# Patient Record
Sex: Male | Born: 1995 | Race: White | Hispanic: No | Marital: Single | Smoking: Current every day smoker
Health system: Southern US, Community
[De-identification: ages and names within clinical notes are randomized; demographics above are authoritative.]

## PROBLEM LIST (undated history)

## (undated) DIAGNOSIS — Z9889 Other specified postprocedural states: Secondary | ICD-10-CM

---
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
## 2017-06-20 ENCOUNTER — Emergency Department
Admission: EM | Admit: 2017-06-20 | Discharge: 2017-06-21 | Disposition: A | Discharge status: Home / Self Care | Payer: Medicaid Other | Attending: Emergency Medicine | Admitting: Emergency Medicine

## 2017-06-20 ENCOUNTER — Other Ambulatory Visit: Payer: Self-pay

## 2017-06-20 ENCOUNTER — Emergency Department: Payer: Medicaid Other

## 2017-06-20 DIAGNOSIS — G8929 Other chronic pain: Secondary | ICD-10-CM | POA: Insufficient documentation

## 2017-06-20 DIAGNOSIS — F17200 Nicotine dependence, unspecified, uncomplicated: Secondary | ICD-10-CM | POA: Diagnosis not present

## 2017-06-20 DIAGNOSIS — M25562 Pain in left knee: Secondary | ICD-10-CM | POA: Insufficient documentation

## 2017-06-20 MED ORDER — MELOXICAM 15 MG PO TABS: 15.0000 mg | ORAL_TABLET | Freq: Every day | ORAL | 0 refills | Status: AC

## 2017-06-20 MED ORDER — CYCLOBENZAPRINE HCL 5 MG PO TABS: 5.0000 mg | ORAL_TABLET | Freq: Three times a day (TID) | ORAL | 0 refills | Status: DC | PRN
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
## 2017-06-20 NOTE — Discharge Instructions (Signed)
Your exam and x-ray are negative for any acute fracture, dislocation, or meniscus injury. You should follow-up with ortho for continued symptoms. Take the prescription meds as directed. Wear the ace bandage as needed for support.

## 2017-06-20 NOTE — ED Notes (Signed)
Patient c/o chronic back and left knee pain with increasing severity the last few days. Patient reports his left knee has been locking up more than normal.

## 2017-06-21 NOTE — ED Notes (Signed)
Pt denied wanting ace wrap. Ace wrap not placed.

## 2017-06-21 NOTE — ED Notes (Signed)
Reviewed d/c instructions, follow-up care, prescriptions with patient. Patient verbalized understanding.  

## 2017-12-07 ENCOUNTER — Emergency Department
Admission: EM | Admit: 2017-12-07 | Discharge: 2017-12-07 | Disposition: A | Discharge status: Home / Self Care | Payer: Medicaid Other | Attending: Emergency Medicine | Admitting: Emergency Medicine

## 2017-12-07 ENCOUNTER — Other Ambulatory Visit: Payer: Self-pay

## 2017-12-07 DIAGNOSIS — Y999 Unspecified external cause status: Secondary | ICD-10-CM | POA: Diagnosis not present

## 2017-12-07 DIAGNOSIS — Y939 Activity, unspecified: Secondary | ICD-10-CM | POA: Diagnosis not present

## 2017-12-07 DIAGNOSIS — Y929 Unspecified place or not applicable: Secondary | ICD-10-CM | POA: Diagnosis not present

## 2017-12-07 DIAGNOSIS — T148XXA Other injury of unspecified body region, initial encounter: Secondary | ICD-10-CM

## 2017-12-07 DIAGNOSIS — S3992XA Unspecified injury of lower back, initial encounter: Secondary | ICD-10-CM | POA: Diagnosis present

## 2017-12-07 DIAGNOSIS — Z23 Encounter for immunization: Secondary | ICD-10-CM | POA: Insufficient documentation

## 2017-12-07 DIAGNOSIS — F17200 Nicotine dependence, unspecified, uncomplicated: Secondary | ICD-10-CM | POA: Diagnosis not present

## 2017-12-07 DIAGNOSIS — S39012A Strain of muscle, fascia and tendon of lower back, initial encounter: Secondary | ICD-10-CM | POA: Insufficient documentation

## 2017-12-07 DIAGNOSIS — X500XXA Overexertion from strenuous movement or load, initial encounter: Secondary | ICD-10-CM | POA: Insufficient documentation

## 2017-12-07 HISTORY — DX: Other specified postprocedural states: Z98.890

## 2017-12-07 LAB — URINALYSIS, COMPLETE (UACMP) WITH MICROSCOPIC
BACTERIA UA: NONE SEEN
Bilirubin Urine: NEGATIVE
GLUCOSE, UA: NEGATIVE mg/dL
Hgb urine dipstick: NEGATIVE
Ketones, ur: NEGATIVE mg/dL
Leukocytes, UA: NEGATIVE
Nitrite: NEGATIVE
Protein, ur: NEGATIVE mg/dL
Specific Gravity, Urine: 1.017 (ref 1.005–1.030)
pH: 6 (ref 5.0–8.0)

## 2017-12-07 MED ORDER — MELOXICAM 15 MG PO TABS: 15.0000 mg | ORAL_TABLET | Freq: Every day | ORAL | 0 refills | Status: AC

## 2017-12-07 MED ORDER — TETANUS-DIPHTH-ACELL PERTUSSIS 5-2.5-18.5 LF-MCG/0.5 IM SUSP
0.5000 mL | Freq: Once | INTRAMUSCULAR | Status: AC
  Administered 2017-12-07: 0.5 mL via INTRAMUSCULAR
  Filled 2017-12-07: qty 0.5

## 2017-12-07 MED ORDER — CYCLOBENZAPRINE HCL 5 MG PO TABS: ORAL_TABLET | ORAL | 0 refills | Status: DC

## 2017-12-07 NOTE — ED Triage Notes (Addendum)
Pt states mid low back pain since yesterday. Also c/o R pinky cut from unscrewing light bulb. No bleeding noted. Asking for work note in triage. Alert, oriented, ambulatory with difficulty.

## 2017-12-07 NOTE — ED Provider Notes (Signed)
[facility] Emergency Department Provider Note  ____________________________________________  Time seen: Approximately 2:45 PM  I have reviewed the triage vital signs and the nursing notes.   HISTORY  Chief Complaint Back Pain    HPI Melvin Griffith. is a 22 y.o. male that presents to the emergency department for evaluation of back pain for 1 day. He was lifting something heavy yesterday and mowing the yard when pain started. Pain feels like a muscle strain. He has not taken any ibuprofen today because he took a lot yesterday. He also cut himself on a light bulb. No bleeding. He would like a work note. No bowel or bladder dysfunction or saddle paresthesias.  No fever, chills, nausea, vomiting, abdominal pain, dysuria, urgency, frequency.  Past Medical History:  Diagnosis Date  . History of open heart surgery     There are no active problems to display for this patient.   Past Surgical History:  Procedure Laterality Date  . EXPLORATION POST OPERATIVE OPEN HEART  2014    Prior to Admission medications   Medication Sig Start Date End Date Taking? Authorizing Provider  cyclobenzaprine (FLEXERIL) 5 MG tablet Take 1-2 tablets 3 times daily as needed 12/07/17   Enid Derry, PA-C  meloxicam (MOBIC) 15 MG tablet Take 1 tablet (15 mg total) by mouth daily for 10 days. 12/07/17 12/17/17  Enid Derry, PA-C    Allergies Patient has no known allergies.  History reviewed. No pertinent family history.  Social History Social History   Tobacco Use  . Smoking status: Current Every Day Smoker  . Smokeless tobacco: Never Used  Substance Use Topics  . Alcohol use: Yes    Alcohol/week: 1.2 oz    Types: 2 Cans of beer per week  . Drug use: Yes    Types: Marijuana     Review of Systems  Constitutional: No fever/chills Cardiovascular: No chest pain. Respiratory: o SOB. Gastrointestinal: No abdominal pain.  No nausea, no vomiting.  Musculoskeletal:  Positive for back pain.  Skin: Negative for rash, abrasions, lacerations, ecchymosis. Neurological: Negative for numbness or tingling   ____________________________________________   PHYSICAL EXAM:  VITAL SIGNS: ED Triage Vitals [12/07/17 1314]  Enc Vitals Group     BP 134/69     Pulse Rate 100     Resp 18     Temp 98.3 F (36.8 C)     Temp Source Oral     SpO2 99 %     Weight 140 lb (63.5 kg)     Height 6' (1.829 m)     Head Circumference      Peak Flow      Pain Score 7     Pain Loc      Pain Edu?      Excl. in GC?      Constitutional: Alert and oriented. Well appearing and in no acute distress. Eyes: Conjunctivae are normal. PERRL. EOMI. Head: Atraumatic. ENT:      Ears:      Nose: No congestion/rhinnorhea.      Mouth/Throat: Mucous membranes are moist.  Neck: No stridor. Cardiovascular: Normal rate, regular rhythm.  Good peripheral circulation. Respiratory: Normal respiratory effort without tachypnea or retractions. Lungs CTAB. Good air entry to the bases with no decreased or absent breath sounds. Gastrointestinal: Bowel sounds 4 quadrants. Soft and nontender to palpation. No guarding or rigidity. No palpable masses. No distention.  Musculoskeletal: Full range of motion to all extremities. No gross deformities appreciated. Tenderness to palpation  over right lumbar muscle. No lumbar spinal tenderness to palpation. Negative straight leg raise.  Neurologic:  Normal speech and language. No gross focal neurologic deficits are appreciated.  Skin:  Skin is warm, dry and intact. No rash noted. No cut from light bulb noted.  ____________________________________________   LABS (all labs ordered are listed, but only abnormal results are displayed)  Labs Reviewed  URINALYSIS, COMPLETE (UACMP) WITH MICROSCOPIC - Abnormal; Notable for the following components:      Result Value   Color, Urine YELLOW (*)    APPearance CLEAR (*)    All other components within normal  limits   ____________________________________________  EKG   ____________________________________________  RADIOLOGY  No results found.  ____________________________________________    PROCEDURES  Procedure(s) performed:    Procedures    Medications  Tdap (BOOSTRIX) injection 0.5 mL (has no administration in time range)     ____________________________________________   INITIAL IMPRESSION / ASSESSMENT AND PLAN / ED COURSE  Pertinent labs & imaging results that were available during my care of the patient were reviewed by me and considered in my medical decision making (see chart for details).  Review of the [place] CSRS was performed in accordance of the [facility] prior to dispensing any controlled drugs.   Patient's diagnosis is consistent with muscle strain. Vital signs and exam are reassuring. No infection on urinalysis. Tetanus shot was updated. Patient will be discharged home with prescriptions for flexeril and mobic. Patient is to follow up with PCP as directed. Patient is given ED precautions to return to the ED for any worsening or new symptoms.   ____________________________________________  FINAL CLINICAL IMPRESSION(S) / ED DIAGNOSES  Final diagnoses:  Muscle strain      NEW MEDICATIONS STARTED DURING THIS VISIT:  ED Discharge Orders        Ordered    cyclobenzaprine (FLEXERIL) 5 MG tablet     12/07/17 1555    meloxicam (MOBIC) 15 MG tablet  Daily     12/07/17 1555          This chart was dictated using voice recognition software/Dragon. Despite best efforts to proofread, errors can occur which can change the meaning. Any change was purely unintentional.    Enid Derry, PA-C 12/07/17 1640    Sharman Cheek, MD 12/08/17 2027

## 2017-12-07 NOTE — ED Notes (Signed)
See triage note  Presents with right flank area since yesterday  States pain is non radiating  No n/v/  States pain started after mowing yesterday

## 2018-01-19 ENCOUNTER — Encounter: Payer: Self-pay | Admitting: Emergency Medicine

## 2018-01-19 ENCOUNTER — Emergency Department
Admission: EM | Admit: 2018-01-19 | Discharge: 2018-01-19 | Disposition: A | Discharge status: Home / Self Care | Payer: Medicaid Other | Attending: Emergency Medicine | Admitting: Emergency Medicine

## 2018-01-19 DIAGNOSIS — F17200 Nicotine dependence, unspecified, uncomplicated: Secondary | ICD-10-CM | POA: Diagnosis not present

## 2018-01-19 DIAGNOSIS — L237 Allergic contact dermatitis due to plants, except food: Secondary | ICD-10-CM | POA: Diagnosis not present

## 2018-01-19 DIAGNOSIS — R21 Rash and other nonspecific skin eruption: Secondary | ICD-10-CM | POA: Diagnosis present

## 2018-01-19 MED ORDER — TRIAMCINOLONE ACETONIDE 40 MG/ML IJ SUSP
40.0000 mg | Freq: Once | INTRAMUSCULAR | Status: AC
  Administered 2018-01-19: 40 mg via INTRAMUSCULAR
  Filled 2018-01-19: qty 1

## 2018-01-19 MED ORDER — TRIAMCINOLONE ACETONIDE 0.1 % EX CREA: 1.0000 "application " | TOPICAL_CREAM | Freq: Four times a day (QID) | CUTANEOUS | 0 refills | Status: DC

## 2018-01-19 NOTE — ED Notes (Signed)
Pt has itching rash on hands and face since yesterday.  No resp distress.  Pt took otc meds without relief.

## 2018-01-19 NOTE — ED Provider Notes (Signed)
[facility] Emergency Department Provider Note  ____________________________________________  Time seen: Approximately 8:57 PM  I have reviewed the triage vital signs and the nursing notes.   HISTORY  Chief Complaint Poison Ivy    HPI Melvin SeverinChristopher S Springer Jr. is a 22 y.o. male who presents the emergency department complaining of poison ivy to bilateral hands, right-sided neck, left jawline.  Patient reports that he believes he had contact with poison ivy behind his house.  He has extreme pruritus from same.  He has had similar rash in the past from poison ivy.  No other new foods, medications, contact with new topicals such as soaps or shampoos.  Patient tried Benadryl which helped somewhat with the itching but did not improve rash.  No other complaints at this time.    Past Medical History:  Diagnosis Date  . History of open heart surgery     There are no active problems to display for this patient.   Past Surgical History:  Procedure Laterality Date  . EXPLORATION POST OPERATIVE OPEN HEART  2014    Prior to Admission medications   Medication Sig Start Date End Date Taking? Authorizing Provider  cyclobenzaprine (FLEXERIL) 5 MG tablet Take 1-2 tablets 3 times daily as needed 12/07/17   Enid DerryWagner, Ashley, PA-C  triamcinolone cream (KENALOG) 0.1 % Apply 1 application topically 4 (four) times daily. 01/19/18   Cuthriell, Delorise RoyalsJonathan D, PA-C    Allergies Patient has no known allergies.  No family history on file.  Social History Social History   Tobacco Use  . Smoking status: Current Every Day Smoker  . Smokeless tobacco: Never Used  Substance Use Topics  . Alcohol use: Yes    Alcohol/week: 1.2 oz    Types: 2 Cans of beer per week  . Drug use: Yes    Types: Marijuana     Review of Systems  Constitutional: No fever/chills Eyes: No visual changes. No discharge ENT: No upper respiratory complaints. Cardiovascular: no chest pain. Respiratory: no  cough. No SOB. Gastrointestinal: No abdominal pain.  No nausea, no vomiting.  Musculoskeletal: Negative for musculoskeletal pain. Skin: Poison ivy rash to bilateral hands, right neck, left jaw Neurological: Negative for headaches, focal weakness or numbness. 10-point ROS otherwise negative.  ____________________________________________   PHYSICAL EXAM:  VITAL SIGNS: ED Triage Vitals  Enc Vitals Group     BP 01/19/18 2020 (!) 146/85     Pulse Rate 01/19/18 2020 87     Resp 01/19/18 2020 12     Temp 01/19/18 2020 99.4 F (37.4 C)     Temp Source 01/19/18 2020 Oral     SpO2 01/19/18 2020 100 %     Weight --      Height --      Head Circumference --      Peak Flow --      Pain Score 01/19/18 2012 3     Pain Loc --      Pain Edu? --      Excl. in GC? --      Constitutional: Alert and oriented. Well appearing and in no acute distress. Eyes: Conjunctivae are normal. PERRL. EOMI. Head: Atraumatic. ENT:      Ears:       Nose: No congestion/rhinnorhea.      Mouth/Throat: Mucous membranes are moist.  No oropharyngeal edema. Neck: No stridor.    Cardiovascular: Normal rate, regular rhythm. Normal S1 and S2.  Good peripheral circulation. Respiratory: Normal respiratory effort without tachypnea or  retractions. Lungs CTAB. Good air entry to the bases with no decreased or absent breath sounds. Musculoskeletal: Full range of motion to all extremities. No gross deformities appreciated. Neurologic:  Normal speech and language. No gross focal neurologic deficits are appreciated.  Skin:  Skin is warm, dry and intact.  Maculopapular rash to bilateral hands, fingers, right neck, left jaw.  Rash is consistent with poison ivy dermatitis.  No other skin findings. Psychiatric: Mood and affect are normal. Speech and behavior are normal. Patient exhibits appropriate insight and judgement.   ____________________________________________   LABS (all labs ordered are listed, but only abnormal  results are displayed)  Labs Reviewed - No data to display ____________________________________________  EKG   ____________________________________________  RADIOLOGY   No results found.  ____________________________________________    PROCEDURES  Procedure(s) performed:    Procedures    Medications  triamcinolone acetonide (KENALOG-40) injection 40 mg (has no administration in time range)     ____________________________________________   INITIAL IMPRESSION / ASSESSMENT AND PLAN / ED COURSE  Pertinent labs & imaging results that were available during my care of the patient were reviewed by me and considered in my medical decision making (see chart for details).  Review of the [place] CSRS was performed in accordance of the [facility] prior to dispensing any controlled drugs.      Patient's diagnosis is consistent with poison ivy dermatitis.  Patient presents with pruritic rash to bilateral hands, right neck, left jaw.  Rash is consistent with poison ivy dermatitis.  Patient is given injection of Kenalog in the emergency department.  Topical triamcinolone is prescribed for home use.  Patient will follow primary care as needed. Patient is given ED precautions to return to the ED for any worsening or new symptoms.     ____________________________________________  FINAL CLINICAL IMPRESSION(S) / ED DIAGNOSES  Final diagnoses:  Poison ivy dermatitis      NEW MEDICATIONS STARTED DURING THIS VISIT:  ED Discharge Orders        Ordered    triamcinolone cream (KENALOG) 0.1 %  4 times daily     01/19/18 2107          This chart was dictated using voice recognition software/Dragon. Despite best efforts to proofread, errors can occur which can change the meaning. Any change was purely unintentional.    Racheal Patches, PA-C 01/19/18 2110    Phineas Semen, MD 01/19/18 2142

## 2018-01-19 NOTE — ED Triage Notes (Signed)
Pt comes into the ED via POV c/o poison ivy to the left hand and possibly the right side of the face.  Patient in NAD this time.
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
## 2019-06-27 ENCOUNTER — Emergency Department
Admission: EM | Admit: 2019-06-27 | Discharge: 2019-06-27 | Disposition: A | Discharge status: Home / Self Care | Payer: Medicaid Other | Attending: Emergency Medicine | Admitting: Emergency Medicine

## 2019-06-27 ENCOUNTER — Other Ambulatory Visit: Payer: Self-pay

## 2019-06-27 ENCOUNTER — Encounter: Payer: Self-pay | Admitting: Emergency Medicine

## 2019-06-27 DIAGNOSIS — Y939 Activity, unspecified: Secondary | ICD-10-CM | POA: Insufficient documentation

## 2019-06-27 DIAGNOSIS — S61412A Laceration without foreign body of left hand, initial encounter: Secondary | ICD-10-CM

## 2019-06-27 DIAGNOSIS — W268XXA Contact with other sharp object(s), not elsewhere classified, initial encounter: Secondary | ICD-10-CM | POA: Insufficient documentation

## 2019-06-27 DIAGNOSIS — Y990 Civilian activity done for income or pay: Secondary | ICD-10-CM | POA: Insufficient documentation

## 2019-06-27 DIAGNOSIS — Y929 Unspecified place or not applicable: Secondary | ICD-10-CM | POA: Insufficient documentation

## 2019-06-27 DIAGNOSIS — F17210 Nicotine dependence, cigarettes, uncomplicated: Secondary | ICD-10-CM | POA: Insufficient documentation

## 2019-06-27 MED ORDER — CLINDAMYCIN HCL 150 MG PO CAPS
300.0000 mg | ORAL_CAPSULE | Freq: Once | ORAL | Status: AC
  Administered 2019-06-27: 18:00:00 300 mg via ORAL
  Filled 2019-06-27: qty 2

## 2019-06-27 MED ORDER — CLINDAMYCIN HCL 300 MG PO CAPS: 300.0000 mg | ORAL_CAPSULE | Freq: Four times a day (QID) | ORAL | 0 refills | Status: DC

## 2019-06-27 NOTE — ED Notes (Signed)
See triage note  States he hit his left hand on a piece of metal    This occurred this past weekend

## 2019-06-27 NOTE — ED Triage Notes (Signed)
Pt reports this weekend was working with some scrap metal and cut his left hand knuckle. Pt reports when he went to work today he was a little shaky and his job told him that he needed a work note.

## 2019-06-27 NOTE — ED Provider Notes (Signed)
[facility] Emergency Department Provider Note  ____________________________________________  Time seen: Approximately 5:30 PM  I have reviewed the triage vital signs and the nursing notes.   HISTORY  Chief Complaint Laceration    HPI Melvin Griffith. is a 23 y.o. male who presents the emergency department for evaluation of injury to the knuckle of the index finger left hand.  Patient was working with some scrap metal 2 days ago when he sustained a laceration over the joint.  Patient reports the area is painful, yesterday it was swollen and erythematous.  Patient reports that these edema and erythema has improved though the pain continues.  Patient has good range of motion to the digit.  No medications prior to arrival.         Past Medical History:  Diagnosis Date  . History of open heart surgery     There are no active problems to display for this patient.   Past Surgical History:  Procedure Laterality Date  . EXPLORATION POST OPERATIVE OPEN HEART  2014    Prior to Admission medications   Medication Sig Start Date End Date Taking? Authorizing Provider  clindamycin (CLEOCIN) 300 MG capsule Take 1 capsule (300 mg total) by mouth 4 (four) times daily. 06/27/19   Ahnya Akre, Delorise Royals, PA-C    Allergies Patient has no known allergies.  No family history on file.  Social History Social History   Tobacco Use  . Smoking status: Current Every Day Smoker  . Smokeless tobacco: Never Used  Substance Use Topics  . Alcohol use: Yes    Alcohol/week: 2.0 standard drinks    Types: 2 Cans of beer per week  . Drug use: Yes    Types: Marijuana     Review of Systems  Constitutional: No fever/chills Eyes: No visual changes. No discharge ENT: No upper respiratory complaints. Cardiovascular: no chest pain. Respiratory: no cough. No SOB. Gastrointestinal: No abdominal pain.  No nausea, no vomiting.   Musculoskeletal: Negative for  musculoskeletal pain. Skin: Laceration overlying the MCP joint of the second digit left hand Neurological: Negative for headaches, focal weakness or numbness. 10-point ROS otherwise negative.  ____________________________________________   PHYSICAL EXAM:  VITAL SIGNS: ED Triage Vitals  Enc Vitals Group     BP 06/27/19 1631 (!) 162/91     Pulse Rate 06/27/19 1631 84     Resp 06/27/19 1631 20     Temp 06/27/19 1633 98.3 F (36.8 C)     Temp Source 06/27/19 1633 Oral     SpO2 06/27/19 1631 100 %     Weight 06/27/19 1632 160 lb (72.6 kg)     Height 06/27/19 1632 6\' 1"  (1.854 m)     Head Circumference --      Peak Flow --      Pain Score 06/27/19 1632 7     Pain Loc --      Pain Edu? --      Excl. in GC? --      Constitutional: Alert and oriented. Well appearing and in no acute distress. Eyes: Conjunctivae are normal. PERRL. EOMI. Head: Atraumatic. ENT:      Ears:       Nose: No congestion/rhinnorhea.      Mouth/Throat: Mucous membranes are moist.  Neck: No stridor.    Cardiovascular: Normal rate, regular rhythm. Normal S1 and S2.  Good peripheral circulation. Respiratory: Normal respiratory effort without tachypnea or retractions. Lungs CTAB. Good air entry to the bases with no  decreased or absent breath sounds. Musculoskeletal: Full range of motion to all extremities. No gross deformities appreciated.  Visualization of the left hand reveals linear laceration overlying the MCP joint on the dorsal aspect second digit.  Edges are smooth in nature.  They are gaped open at this time.  Visualization reveals no deep injury concerning for tendon laceration.  Patient has good extension and flexion of the digit at this time.  Sensation and capillary refill intact distally.  Edges are smooth in nature, show signs of tissue death consistent with an injury that occurred 2 days ago.  No foreign body.  No bleeding.  No gross surrounding erythema or edema.  No streaking. Neurologic:  Normal  speech and language. No gross focal neurologic deficits are appreciated.  Skin:  Skin is warm, dry and intact. No rash noted. Psychiatric: Mood and affect are normal. Speech and behavior are normal. Patient exhibits appropriate insight and judgement.   ____________________________________________   LABS (all labs ordered are listed, but only abnormal results are displayed)  Labs Reviewed - No data to display ____________________________________________  EKG   ____________________________________________  RADIOLOGY   No results found.  ____________________________________________    PROCEDURES  Procedure(s) performed:    Procedures    Medications  clindamycin (CLEOCIN) capsule 300 mg (has no administration in time range)     ____________________________________________   INITIAL IMPRESSION / ASSESSMENT AND PLAN / ED COURSE  Pertinent labs & imaging results that were available during my care of the patient were reviewed by me and considered in my medical decision making (see chart for details).  Review of the [place] CSRS was performed in accordance of the [place] prior to dispensing any controlled drugs.           Patient's diagnosis is consistent with laceration to the left hand.  Patient presented to emergency department with 2-day old laceration overlying the MCP joint of the second digit left hand.  No gross signs of infection.  Patient has good extension and flexion of the digit with no concern at this time for tendon injury.  Patient will be placed on antibiotics prophylactically.  Wound care instructions discussed with patient.  Follow-up precautions are also discussed at length with the patient.  Patient's tetanus shot is up-to-date and not repeated at this time..  Patient is given ED precautions to return to the ED for any worsening or new symptoms.     ____________________________________________  FINAL CLINICAL IMPRESSION(S) / ED DIAGNOSES  Final  diagnoses:  Laceration of left hand without foreign body, initial encounter      NEW MEDICATIONS STARTED DURING THIS VISIT:  ED Discharge Orders         Ordered    clindamycin (CLEOCIN) 300 MG capsule  4 times daily     06/27/19 1758              This chart was dictated using voice recognition software/Dragon. Despite best efforts to proofread, errors can occur which can change the meaning. Any change was purely unintentional.    Darletta Moll, PA-C 06/27/19 Orville Govern, MD 06/27/19 2238

## 2020-04-30 ENCOUNTER — Encounter: Payer: Self-pay | Admitting: *Deleted

## 2020-04-30 ENCOUNTER — Other Ambulatory Visit: Payer: Self-pay

## 2020-04-30 DIAGNOSIS — F17200 Nicotine dependence, unspecified, uncomplicated: Secondary | ICD-10-CM | POA: Insufficient documentation

## 2020-04-30 DIAGNOSIS — K047 Periapical abscess without sinus: Secondary | ICD-10-CM | POA: Insufficient documentation

## 2020-04-30 NOTE — ED Triage Notes (Signed)
Pt has right upper toothache for 4 days.   Swelling to right side of face.  Taking motrin without relief.  Pt alert.

## 2020-05-01 ENCOUNTER — Emergency Department
Admission: EM | Admit: 2020-05-01 | Discharge: 2020-05-01 | Disposition: A | Discharge status: Home / Self Care | Payer: Self-pay | Attending: Physician Assistant | Admitting: Physician Assistant

## 2020-05-01 DIAGNOSIS — K047 Periapical abscess without sinus: Secondary | ICD-10-CM

## 2020-05-01 MED ORDER — IBUPROFEN 600 MG PO TABS: 600.0000 mg | ORAL_TABLET | Freq: Three times a day (TID) | ORAL | 0 refills | Status: DC | PRN

## 2020-05-01 MED ORDER — TRAMADOL HCL 50 MG PO TABS
50.0000 mg | ORAL_TABLET | Freq: Once | ORAL | Status: AC
  Administered 2020-05-01: 08:00:00 50 mg via ORAL
  Filled 2020-05-01: qty 1

## 2020-05-01 MED ORDER — AMOXICILLIN 500 MG PO CAPS: 500.0000 mg | ORAL_CAPSULE | Freq: Three times a day (TID) | ORAL | 0 refills | Status: DC

## 2020-05-01 MED ORDER — IBUPROFEN 600 MG PO TABS
600.0000 mg | ORAL_TABLET | Freq: Once | ORAL | Status: AC
  Administered 2020-05-01: 08:00:00 600 mg via ORAL
  Filled 2020-05-01: qty 1

## 2020-05-01 MED ORDER — TRAMADOL HCL 50 MG PO TABS: 50.0000 mg | ORAL_TABLET | Freq: Four times a day (QID) | ORAL | 0 refills | Status: AC | PRN

## 2020-05-01 MED ORDER — LIDOCAINE VISCOUS HCL 2 % MT SOLN
15.0000 mL | Freq: Once | OROMUCOSAL | Status: AC
  Administered 2020-05-01: 08:00:00 15 mL via OROMUCOSAL
  Filled 2020-05-01: qty 15

## 2020-05-01 NOTE — ED Notes (Signed)
See triage note  Presents with facial swelling and dental pain  States pain started about 4 days ago

## 2020-05-01 NOTE — ED Notes (Signed)
Pt not found in lobby for vs retake

## 2020-05-01 NOTE — Discharge Instructions (Addendum)
Follow discharge care instruction take medication as directed.  Follow-up for list of dental clinics in your discharge care instructions. OPTIONS FOR DENTAL FOLLOW UP CARE  [place] [facility] and [facility] Services - Local Safety Net Dental Clinics [email or web address]   [facility] [phone])  [facility] [phone])  [facility] [phone])  [facility] [phone])  [facility] [phone]) This clinic caters to the indigent population and is on a lottery system. Location: [employer] [facility], [employer], [address] [place] Clinic Hours: Wednesdays from 6pm - 9pm, patients seen by a lottery system. For dates, call or go to [email or web address] Services: Cleanings, fillings and simple extractions. Payment Options: DENTAL WORK IS FREE OF CHARGE. Bring proof of income or support. Best way to get seen: Arrive at 5:15 pm - this is a lottery, NOT first come/first serve, so arriving earlier will not increase your chances of being seen.     [facility] [phone] Select option 1 for emergencies   Location: [facility], [place], [address], [place] Clinic Hours: No walk-ins accepted - call the day before to schedule an appointment. Check in times are 9:30 am and 1:30 pm. Services: Simple extractions, temporary fillings, pulpectomy/pulp debridement, uncomplicated abscess drainage. Payment Options: PAYMENT IS DUE AT THE TIME OF SERVICE.  Fee is usually $100-200, additional surgical procedures (e.g. abscess drainage) may be extra. Cash, checks, Visa/MasterCard accepted.  Can file Medicaid if patient is covered for dental - patient should call case worker to check. No discount for [facility] patients. Best way to get seen: MUST call the day before and get  onto the schedule. Can usually be seen the next 1-2 days. No walk-ins accepted.     [facility] [phone]   Location: [facility], [address], [place] Clinic Hours: M, W, Th, F 8am or 1:30pm, Tues 9a or 1:30 - first come/first served. Services: Simple extractions, temporary fillings, uncomplicated abscess drainage.  You do not need to be an [facility] resident. Payment Options: PAYMENT IS DUE AT THE TIME OF SERVICE. Dental insurance, otherwise sliding scale - bring proof of income or support. Depending on income and treatment needed, cost is usually $50-200. Best way to get seen: Arrive early as it is first come/first served.     [facility] [phone]   Location: [address] Clinic Hours: Mon-Thu 8a-5p Services: Most basic dental services including extractions and fillings. Payment Options: PAYMENT IS DUE AT THE TIME OF SERVICE. Sliding scale, up to 50% off - bring proof if income or support. Medicaid with dental option accepted. Best way to get seen: Call to schedule an appointment, can usually be seen within 2 weeks OR they will try to see walk-ins - show up at 8a or 2p (you may have to wait).     [facility] [phone] [place] RESIDENTS ONLY   Location: [facility], [address], [place], [place] [address] Clinic Hours: By appointment only. Monday - Thursday 8am-5pm, Friday 8am-12pm Services: Cleanings, fillings, extractions. Payment Options: PAYMENT IS DUE AT THE TIME OF SERVICE. Cash, Visa or MasterCard. Sliding scale - $30 minimum per service. Best way to get seen: Come in to office, complete packet and make an appointment - need proof of income or support monies for each household member and proof of [facility] residence. Usually takes about a  month to get in.     [facility] [phone]   Location: [address], [place] Clinic Hours: Walk-in Urgent Care Dental Services are offered Monday-Friday mornings only. The numbers of emergencies accepted daily is limited to the number of providers available. Maximum 15 - Mondays, Wednesdays & Thursdays Maximum 10 - Tuesdays & Fridays Services: You do not need to be a [facility] resident to be seen for a dental emergency. Emergencies are defined as pain, swelling, abnormal bleeding, or dental trauma. Walkins will receive x-rays if needed. NOTE: Dental cleaning is not an emergency. Payment Options: PAYMENT IS DUE AT THE TIME OF SERVICE. Minimum co-pay is $40.00 for uninsured patients. Minimum co-pay is $3.00 for Medicaid with dental coverage. Dental Insurance is accepted and must be presented at time of visit. Medicare does not cover dental. Forms of payment: Cash, credit card, checks. Best way to get seen: If not previously registered with the clinic, walk-in dental registration begins at 7:15 am and is on a first come/first serve basis. If previously registered with the clinic, call to make an appointment.     [facility] [place] ONLY   Location: [address], [place], [place] Clinic Hours: Mon-Thu 10a-2p Services: Extractions only! Payment Options: FREE (donations accepted) - bring proof of income or support Best way to get seen: Call and schedule an appointment OR come at 8am on the 1st Monday of every month (except for holidays) when it is first come/first served.     [facility] [phone]   Location: [place], [place] Clinic Hours: Friday mornings Services, Payment Options, Best way to get seen: Call for info

## 2020-05-01 NOTE — ED Provider Notes (Signed)
[facility] Emergency Department Provider Note   ____________________________________________   None    (approximate)  I have reviewed the triage vital signs and the nursing notes.   HISTORY  Chief Complaint Dental Pain    HPI Melvin Dubie. is a 24 y.o. male patient presents with toothache for 4 days.  Patient in the last 24 hours increased swelling and pain to the right side of his face.  Patient states Motrin is not relieving the pain.  Patient denies fever associated with complaint.  Patient j does not have a dentist.  Rates pain as a 10/10.  Described pain as "achy".      Past Medical History:  Diagnosis Date  . History of open heart surgery     There are no problems to display for this patient.   Past Surgical History:  Procedure Laterality Date  . EXPLORATION POST OPERATIVE OPEN HEART  2014    Prior to Admission medications   Medication Sig Start Date End Date Taking? Authorizing Provider  amoxicillin (AMOXIL) 500 MG capsule Take 1 capsule (500 mg total) by mouth 3 (three) times daily. 05/01/20   Joni Reining, PA-C  ibuprofen (ADVIL) 600 MG tablet Take 1 tablet (600 mg total) by mouth every 8 (eight) hours as needed. 05/01/20   Joni Reining, PA-C  traMADol (ULTRAM) 50 MG tablet Take 1 tablet (50 mg total) by mouth every 6 (six) hours as needed. 05/01/20 05/01/21  Joni Reining, PA-C    Allergies Patient has no known allergies.  No family history on file.  Social History Social History   Tobacco Use  . Smoking status: Current Every Day Smoker  . Smokeless tobacco: Never Used  Substance Use Topics  . Alcohol use: Yes    Alcohol/week: 2.0 standard drinks    Types: 2 Cans of beer per week  . Drug use: Yes    Types: Marijuana    Review of Systems Constitutional: No fever/chills Eyes: No visual changes. ENT: No sore throat.  Dental pain. Cardiovascular: Denies chest pain. Respiratory: Denies shortness of  breath. Gastrointestinal: No abdominal pain.  No nausea, no vomiting.  No diarrhea.  No constipation. Genitourinary: Negative for dysuria. Musculoskeletal: Negative for back pain. Skin: Negative for rash. Neurological: Negative for headaches, focal weakness or numbness.   ____________________________________________   PHYSICAL EXAM:  VITAL SIGNS: ED Triage Vitals  Enc Vitals Group     BP 04/30/20 2313 (!) 144/89     Pulse Rate 04/30/20 2313 98     Resp 04/30/20 2313 20     Temp 04/30/20 2313 98.7 F (37.1 C)     Temp Source 04/30/20 2313 Oral     SpO2 04/30/20 2313 98 %     Weight 04/30/20 2311 145 lb (65.8 kg)     Height 04/30/20 2311 6\' 1"  (1.854 m)     Head Circumference --      Peak Flow --      Pain Score 04/30/20 2311 10     Pain Loc --      Pain Edu? --      Excl. in GC? --    Constitutional: Alert and oriented. Well appearing and in no acute distress. Eyes: Conjunctivae are normal. PERRL. EOMI. Head: Atraumatic. Nose: No congestion/rhinnorhea. Mouth/Throat: Mucous membranes are moist.  Oropharynx non-erythematous.  Multiple dental caries of the right upper molar. Hematological/Lymphatic/Immunilogical: No cervical lymphadenopathy. Cardiovascular: Normal rate, regular rhythm. Grossly normal heart sounds.  Good peripheral circulation.  Respiratory: Normal respiratory effort.  No retractions. Lungs CTAB. Skin:  Skin is warm, dry and intact. No rash noted.  Edema right lateral maxillary facial area Psychiatric: Mood and affect are normal. Speech and behavior are normal.  ____________________________________________   LABS (all labs ordered are listed, but only abnormal results are displayed)  Labs Reviewed - No data to display ____________________________________________  EKG   ____________________________________________  RADIOLOGY  ED MD interpretation:    Official radiology report(s): No results  found.  ____________________________________________   PROCEDURES  Procedure(s) performed (including Critical Care):  Procedures   ____________________________________________   INITIAL IMPRESSION / ASSESSMENT AND PLAN / ED COURSE  As part of my medical decision making, I reviewed the following data within the electronic MEDICAL RECORD NUMBER     Patient presents with pain and facial edema secondary to dental caries/abscess.  Patient given discharge care instruction advised take medication as directed.  Patient advised to follow-up with [place] to do to clinic provided discharge care instructions.          ____________________________________________   FINAL CLINICAL IMPRESSION(S) / ED DIAGNOSES  Final diagnoses:  Dental abscess     ED Discharge Orders         Ordered    amoxicillin (AMOXIL) 500 MG capsule  3 times daily        05/01/20 0728    traMADol (ULTRAM) 50 MG tablet  Every 6 hours PRN        05/01/20 0728    ibuprofen (ADVIL) 600 MG tablet  Every 8 hours PRN        05/01/20 0728    amoxicillin (AMOXIL) 500 MG capsule  3 times daily        Pending    ibuprofen (ADVIL) 600 MG tablet  Every 8 hours PRN        Pending    traMADol (ULTRAM) 50 MG tablet  Every 6 hours PRN        Pending          *Please note:  Melvin Griffith. was evaluated in Emergency Department on 05/01/2020 for the symptoms described in the history of present illness. He was evaluated in the context of the global COVID-19 pandemic, which necessitated consideration that the patient might be at risk for infection with the SARS-CoV-2 virus that causes COVID-19. Institutional protocols and algorithms that pertain to the evaluation of patients at risk for COVID-19 are in a state of rapid change based on information released by regulatory bodies including the CDC and federal and state organizations. These policies and algorithms were followed during the patient's care in the ED.  Some ED  evaluations and interventions may be delayed as a result of limited staffing during and the pandemic.*   Note:  This document was prepared using Dragon voice recognition software and may include unintentional dictation errors.    Joni Reining, PA-C 05/01/20 4540    Willy Eddy, MD 05/01/20 1017

## 2023-02-09 ENCOUNTER — Inpatient Hospital Stay
Admission: EM | Admit: 2023-02-09 | Discharge: 2023-02-11 | DRG: 917 | Disposition: A | Discharge status: Home / Self Care | Payer: Self-pay | Attending: Internal Medicine | Admitting: Internal Medicine

## 2023-02-09 ENCOUNTER — Other Ambulatory Visit: Payer: Self-pay

## 2023-02-09 ENCOUNTER — Emergency Department: Payer: Self-pay

## 2023-02-09 DIAGNOSIS — Z720 Tobacco use: Secondary | ICD-10-CM | POA: Diagnosis present

## 2023-02-09 DIAGNOSIS — F1910 Other psychoactive substance abuse, uncomplicated: Secondary | ICD-10-CM | POA: Diagnosis present

## 2023-02-09 DIAGNOSIS — F1011 Alcohol abuse, in remission: Secondary | ICD-10-CM | POA: Diagnosis present

## 2023-02-09 DIAGNOSIS — R4182 Altered mental status, unspecified: Secondary | ICD-10-CM | POA: Diagnosis present

## 2023-02-09 DIAGNOSIS — E876 Hypokalemia: Secondary | ICD-10-CM | POA: Diagnosis present

## 2023-02-09 DIAGNOSIS — R400 Somnolence: Secondary | ICD-10-CM

## 2023-02-09 DIAGNOSIS — Y929 Unspecified place or not applicable: Secondary | ICD-10-CM

## 2023-02-09 DIAGNOSIS — F1210 Cannabis abuse, uncomplicated: Secondary | ICD-10-CM | POA: Diagnosis present

## 2023-02-09 DIAGNOSIS — T40601A Poisoning by unspecified narcotics, accidental (unintentional), initial encounter: Principal | ICD-10-CM

## 2023-02-09 DIAGNOSIS — G928 Other toxic encephalopathy: Secondary | ICD-10-CM | POA: Diagnosis present

## 2023-02-09 DIAGNOSIS — I469 Cardiac arrest, cause unspecified: Secondary | ICD-10-CM | POA: Diagnosis present

## 2023-02-09 DIAGNOSIS — F17210 Nicotine dependence, cigarettes, uncomplicated: Secondary | ICD-10-CM | POA: Diagnosis present

## 2023-02-09 DIAGNOSIS — E872 Acidosis, unspecified: Secondary | ICD-10-CM | POA: Diagnosis present

## 2023-02-09 LAB — COMPREHENSIVE METABOLIC PANEL
ALT: 19 U/L (ref 0–44)
AST: 23 U/L (ref 15–41)
Albumin: 4.5 g/dL (ref 3.5–5.0)
Alkaline Phosphatase: 58 U/L (ref 38–126)
Anion gap: 10 (ref 5–15)
BUN: 22 mg/dL — ABNORMAL HIGH (ref 6–20)
CO2: 22 mmol/L (ref 22–32)
Calcium: 8.3 mg/dL — ABNORMAL LOW (ref 8.9–10.3)
Chloride: 104 mmol/L (ref 98–111)
Creatinine, Ser: 1.14 mg/dL (ref 0.61–1.24)
GFR, Estimated: >60 mL/min (ref >60)
Glucose, Bld: 130 mg/dL — ABNORMAL HIGH (ref 70–99)
Potassium: 2.7 mmol/L — CL (ref 3.5–5.1)
Sodium: 136 mmol/L (ref 135–145)
Total Bilirubin: 0.6 mg/dL (ref 0.3–1.2)
Total Protein: 7.6 g/dL (ref 6.5–8.1)

## 2023-02-09 LAB — CBC WITH DIFFERENTIAL/PLATELET
Abs Immature Granulocytes: 0.05 10*3/uL (ref 0.00–0.07)
Basophils Absolute: 0.1 10*3/uL (ref 0.0–0.1)
Basophils Relative: 1 %
Eosinophils Absolute: 0.2 10*3/uL (ref 0.0–0.5)
Eosinophils Relative: 2 %
HCT: 45.3 % (ref 39.0–52.0)
Hemoglobin: 14.7 g/dL (ref 13.0–17.0)
Immature Granulocytes: 0 %
Lymphocytes Relative: 30 %
Lymphs Abs: 3.4 10*3/uL (ref 0.7–4.0)
MCH: 27 pg (ref 26.0–34.0)
MCHC: 32.5 g/dL (ref 30.0–36.0)
MCV: 83.1 fL (ref 80.0–100.0)
Monocytes Absolute: 1 10*3/uL (ref 0.1–1.0)
Monocytes Relative: 9 %
Neutro Abs: 6.6 10*3/uL (ref 1.7–7.7)
Neutrophils Relative %: 58 %
Platelets: 275 10*3/uL (ref 150–400)
RBC: 5.45 MIL/uL (ref 4.22–5.81)
RDW: 13 % (ref 11.5–15.5)
WBC: 11.3 10*3/uL — ABNORMAL HIGH (ref 4.0–10.5)
nRBC: 0 % (ref 0.0–0.2)

## 2023-02-09 LAB — CK: Total CK: 275 U/L (ref 49–397)

## 2023-02-09 LAB — LACTIC ACID, PLASMA: Lactic Acid, Venous: 1 mmol/L (ref 0.5–1.9)

## 2023-02-09 LAB — TROPONIN I (HIGH SENSITIVITY)
Troponin I (High Sensitivity): 6 ng/L (ref <18)
Troponin I (High Sensitivity): 32 ng/L — ABNORMAL HIGH (ref <18)

## 2023-02-09 MED ORDER — NALOXONE HCL 4 MG/10ML IJ SOLN
0.2500 mg/h | INTRAVENOUS | Status: DC
  Administered 2023-02-10: 0.25 mg/h via INTRAVENOUS
  Filled 2023-02-09: qty 10

## 2023-02-09 MED ORDER — SODIUM CHLORIDE 0.9 % IV BOLUS
1000.0000 mL | Freq: Once | INTRAVENOUS | Status: AC
  Administered 2023-02-09: 1000 mL via INTRAVENOUS

## 2023-02-09 MED ORDER — LACTATED RINGERS IV SOLN: INTRAVENOUS | Status: DC

## 2023-02-09 MED ORDER — ACETAMINOPHEN 325 MG PO TABS: 650.0000 mg | ORAL_TABLET | Freq: Four times a day (QID) | ORAL | Status: DC | PRN

## 2023-02-09 MED ORDER — CHLORHEXIDINE GLUCONATE CLOTH 2 % EX PADS
6.0000 | MEDICATED_PAD | Freq: Every day | CUTANEOUS | Status: DC
  Administered 2023-02-10: 6 via TOPICAL

## 2023-02-09 MED ORDER — NICOTINE 21 MG/24HR TD PT24
21.0000 mg | MEDICATED_PATCH | Freq: Every day | TRANSDERMAL | Status: DC
  Administered 2023-02-10: 21 mg via TRANSDERMAL
  Filled 2023-02-09: qty 1

## 2023-02-09 MED ORDER — PANTOPRAZOLE SODIUM 40 MG IV SOLR
40.0000 mg | Freq: Two times a day (BID) | INTRAVENOUS | Status: DC
  Administered 2023-02-10: 40 mg via INTRAVENOUS
  Filled 2023-02-09: qty 10

## 2023-02-09 MED ORDER — HEPARIN SODIUM (PORCINE) 5000 UNIT/ML IJ SOLN
5000.0000 [IU] | Freq: Two times a day (BID) | INTRAMUSCULAR | Status: DC
  Administered 2023-02-10 (×3): 5000 [IU] via SUBCUTANEOUS
  Filled 2023-02-09 (×3): qty 1

## 2023-02-09 MED ORDER — POTASSIUM CHLORIDE 10 MEQ/100ML IV SOLN
10.0000 meq | INTRAVENOUS | Status: AC
  Administered 2023-02-09 (×3): 10 meq via INTRAVENOUS
  Filled 2023-02-09 (×3): qty 100

## 2023-02-09 MED ORDER — THIAMINE HCL 100 MG/ML IJ SOLN: 100.0000 mg | Freq: Every day | INTRAMUSCULAR | Status: DC

## 2023-02-09 MED ORDER — NALOXONE HCL 2 MG/2ML IJ SOSY
1.0000 mg | PREFILLED_SYRINGE | Freq: Once | INTRAMUSCULAR | Status: AC
  Administered 2023-02-09: 1 mg via INTRAVENOUS
  Filled 2023-02-09: qty 2

## 2023-02-09 MED ORDER — SODIUM CHLORIDE 0.9% FLUSH
3.0000 mL | Freq: Two times a day (BID) | INTRAVENOUS | Status: DC
  Administered 2023-02-09 – 2023-02-10 (×3): 3 mL via INTRAVENOUS

## 2023-02-09 MED ORDER — ONDANSETRON HCL 4 MG/2ML IJ SOLN
4.0000 mg | Freq: Once | INTRAMUSCULAR | Status: AC
  Administered 2023-02-09: 4 mg via INTRAVENOUS
  Filled 2023-02-09: qty 2

## 2023-02-09 MED ORDER — NALOXONE HCL 2 MG/2ML IJ SOSY: 1.0000 mg | PREFILLED_SYRINGE | INTRAMUSCULAR | Status: DC | PRN

## 2023-02-09 MED ORDER — ACETAMINOPHEN 650 MG RE SUPP: 650.0000 mg | Freq: Four times a day (QID) | RECTAL | Status: DC | PRN

## 2023-02-09 NOTE — ED Notes (Addendum)
Critical lab reported on patient. Potassium is 2.7. Notified MD Ray

## 2023-02-09 NOTE — ED Notes (Addendum)
Patient attempted to urinate with father's assistance to the restroom. Patient states he is unable to urinate at this time

## 2023-02-09 NOTE — ED Triage Notes (Signed)
Pt arrived via [facility]. Per EMS pt was found on the asphat outside of [place] in [place]. Pt received CPR in the field, 1mg  of narcan brought him around. Pt was alert when he arrived in he ED and is able to answer questions, but very lethargic. Pt is extremely diaphoretic.

## 2023-02-09 NOTE — ED Notes (Signed)
Informed patient that urine was needed.

## 2023-02-09 NOTE — H&P (Signed)
History and Physical    Patient: Melvin Griffith. ZOX:[id number] DOB: Mar 12, 1996 DOA: 02/09/2023 DOS: the patient was seen and examined on 02/09/2023 PCP: Patient, No Pcp Per  Patient coming from:  unknown.   Chief Complaint:  Chief Complaint  Patient presents with   Drug Overdose    HPI: Melvin Griffith. is a 27 y.o. male with medical history significant for polysubstance abuse coming after ? Cardiac arrest or unresponsive.   Review of Systems: Review of Systems  Unable to perform ROS: Mental status change     Past Medical History:  Diagnosis Date   History of open heart surgery    Past Surgical History:  Procedure Laterality Date   EXPLORATION POST OPERATIVE OPEN HEART  2014   Social History:  reports that he has been smoking. He has never used smokeless tobacco. He reports current alcohol use of about 2.0 standard drinks of alcohol per week. He reports current drug use. Drug: Marijuana.  No Known Allergies  No family history on file.  Prior to Admission medications   Medication Sig Start Date End Date Taking? Authorizing Provider  amoxicillin (AMOXIL) 500 MG capsule Take 1 capsule (500 mg total) by mouth 3 (three) times daily. Patient not taking: Reported on 02/09/2023 05/01/20   Joni Reining, PA-C  ibuprofen (ADVIL) 600 MG tablet Take 1 tablet (600 mg total) by mouth every 8 (eight) hours as needed. Patient not taking: Reported on 02/09/2023 05/01/20   Joni Reining, PA-C     Vitals:   02/09/23 2145 02/09/23 2200 02/09/23 2215 02/09/23 2230  BP: 124/88 (!) 135/91 (!) 132/95 (!) 130/95  Pulse: 90 88 87 88  Resp: 16 (!) 9 10 15   Temp:      TempSrc:      SpO2: 97% 94% 96% 93%  Height:       Physical Exam Constitutional:      General: He is not in acute distress.   Labs on Admission: I have personally reviewed following labs and imaging studies  CBC: Recent Labs  Lab 02/09/23 1829  WBC 11.3*  NEUTROABS 6.6  HGB 14.7  HCT 45.3  MCV 83.1   PLT 275   Basic Metabolic Panel: Recent Labs  Lab 02/09/23 1829  NA 136  K 2.7*  CL 104  CO2 22  GLUCOSE 130*  BUN 22*  CREATININE 1.14  CALCIUM 8.3*   GFR: CrCl cannot be calculated (Unknown ideal weight.). Liver Function Tests: Recent Labs  Lab 02/09/23 1829  AST 23  ALT 19  ALKPHOS 58  BILITOT 0.6  PROT 7.6  ALBUMIN 4.5   No results for input(s): "LIPASE", "AMYLASE" in the last 168 hours. No results for input(s): "AMMONIA" in the last 168 hours. Coagulation Profile: No results for input(s): "INR", "PROTIME" in the last 168 hours. Cardiac Enzymes: Recent Labs  Lab 02/09/23 1829  CKTOTAL 275   BNP (last 3 results) No results for input(s): "PROBNP" in the last 8760 hours. HbA1C: No results for input(s): "HGBA1C" in the last 72 hours. CBG: No results for input(s): "GLUCAP" in the last 168 hours. Lipid Profile: No results for input(s): "CHOL", "HDL", "LDLCALC", "TRIG", "CHOLHDL", "LDLDIRECT" in the last 72 hours. Thyroid Function Tests: No results for input(s): "TSH", "T4TOTAL", "FREET4", "T3FREE", "THYROIDAB" in the last 72 hours. Anemia Panel: No results for input(s): "VITAMINB12", "FOLATE", "FERRITIN", "TIBC", "IRON", "RETICCTPCT" in the last 72 hours. Urine analysis: Urinalysis    Component Value Date/Time   COLORURINE YELLOW (A) 12/07/2017  1423   APPEARANCEUR CLEAR (A) 12/07/2017 1423   APPEARANCEUR Clear 10/28/2013 0335   LABSPEC 1.017 12/07/2017 1423   LABSPEC 1.017 10/28/2013 0335   PHURINE 6.0 12/07/2017 1423   GLUCOSEU NEGATIVE 12/07/2017 1423   GLUCOSEU Negative 10/28/2013 0335   HGBUR NEGATIVE 12/07/2017 1423   BILIRUBINUR NEGATIVE 12/07/2017 1423   BILIRUBINUR Negative 10/28/2013 0335   KETONESUR NEGATIVE 12/07/2017 1423   PROTEINUR NEGATIVE 12/07/2017 1423   NITRITE NEGATIVE 12/07/2017 1423   LEUKOCYTESUR NEGATIVE 12/07/2017 1423   LEUKOCYTESUR Negative 10/28/2013 0335    Unresulted Labs (From admission, onward)     Start      Ordered   02/09/23 1829  Urinalysis, Routine w reflex microscopic -Urine, Clean Catch  Once,   URGENT       Question:  Specimen Source  Answer:  Urine, Clean Catch   02/09/23 1828   02/09/23 1829  Urine Drug Screen, Qualitative  Once,   URGENT        02/09/23 1828             Radiological Exams on Admission: DG Chest 1 View  Result Date: 02/09/2023 CLINICAL DATA:  Chest compression, evaluate for fracture or pneumothorax. EXAM: CHEST  1 VIEW COMPARISON:  CT angiogram chest 10/29/2014 FINDINGS: Sternotomy wires are present. The heart size and mediastinal contours are within normal limits. Both lungs are clear. The visualized skeletal structures are unremarkable. IMPRESSION: No active disease. Electronically Signed   By: Darliss Cheney M.D.   On: 02/09/2023 19:12     Data Reviewed: Relevant notes from primary care and specialist visits, past discharge summaries as available in EHR, including Care Everywhere. Prior diagnostic testing as pertinent to current admission diagnoses Updated medications and problem lists for reconciliation ED course, including vitals, labs, imaging, treatment and response to treatment Triage notes, nursing and pharmacy notes and ED provider's notes Notable results as noted in HPI Assessment and Plan: No notes have been filed under this hospital service. Service: Hospitalist       DVT prophylaxis:  Heparin   Consults:  none  Advance Care Planning:    Code Status: Not on file   Family Communication:  none Disposition Plan:  Back to previous home environment  Severity of Illness: The appropriate patient status for this patient is INPATIENT. Inpatient status is judged to be reasonable and necessary in order to provide the required intensity of service to ensure the patient's safety. The patient's presenting symptoms, physical exam findings, and initial radiographic and laboratory data in the context of their chronic comorbidities is felt to place  them at high risk for further clinical deterioration. Furthermore, it is not anticipated that the patient will be medically stable for discharge from the hospital within 2 midnights of admission.   * I certify that at the point of admission it is my clinical judgment that the patient will require inpatient hospital care spanning beyond 2 midnights from the point of admission due to high intensity of service, high risk for further deterioration and high frequency of surveillance required.*  Author: Gertha Calkin, MD 02/09/2023 11:08 PM  For on call review [email or web address].

## 2023-02-09 NOTE — ED Provider Notes (Signed)
[facility] Provider Note    Event Date/Time   First MD Initiated Contact with Patient 02/09/23 1819     (approximate)   History   Drug Overdose   HPI  Thedore Griffith. is a 27 y.o. male presenting to the emergency department for evaluation following a cardiac arrest.  Patient was found unresponsive on the pavement by a bystander.  Unknown downtime.  Reportedly without a pulse, received CPR in the field, unknown duration.  He was given 1 mg of Narcan after which patient awoke.  On presentation to the ER, patient is somnolent but briefly arousable.  He tells me that he snorted something that he thought was fentanyl.  Denies intent of self-harm.  Remembers waking up this more, but is unsure how long he was on the ground.    Physical Exam   Triage Vital Signs: ED Triage Vitals  Enc Vitals Group     BP 02/09/23 1826 128/78     Pulse Rate 02/09/23 1826 90     Resp 02/09/23 1826 11     Temp 02/09/23 1939 97.7 F (36.5 C)     Temp Source 02/09/23 1939 Oral     SpO2 02/09/23 1825 96 %     Weight --      Height 02/09/23 1829 6' (1.829 m)     Head Circumference --      Peak Flow --      Pain Score 02/09/23 1827 0     Pain Loc --      Pain Edu? --      Excl. in GC? --     Most recent vital signs: Vitals:   02/09/23 2215 02/09/23 2230  BP: (!) 132/95 (!) 130/95  Pulse: 87 88  Resp: 10 15  Temp:    SpO2: 96% 93%     General: Somnolent, briefly arouses and speaks in few words, profusely diaphoretic CV:  Regular rate, good peripheral perfusion.  Resp:  Lungs clear, unlabored respirations.  Abd:  Soft, nondistended, no appreciable tenderness Neuro:  No gross facial asymmetry, moving extremities spontaneously, difficult to keep aroused long enough to participate in strength testing   ED Results / Procedures / Treatments   Labs (all labs ordered are listed, but only abnormal results are displayed) Labs Reviewed  CBC WITH  DIFFERENTIAL/PLATELET - Abnormal; Notable for the following components:      Result Value   WBC 11.3 (*)    All other components within normal limits  COMPREHENSIVE METABOLIC PANEL - Abnormal; Notable for the following components:   Potassium 2.7 (*)    Glucose, Bld 130 (*)    BUN 22 (*)    Calcium 8.3 (*)    All other components within normal limits  TROPONIN I (HIGH SENSITIVITY) - Abnormal; Notable for the following components:   Troponin I (High Sensitivity) 32 (*)    All other components within normal limits  CK  URINALYSIS, ROUTINE W REFLEX MICROSCOPIC  URINE DRUG SCREEN, QUALITATIVE ([facility] ONLY)  D-DIMER, QUANTITATIVE  LACTIC ACID, PLASMA  LACTIC ACID, PLASMA  ETHANOL  TROPONIN I (HIGH SENSITIVITY)     EKG EKG independently reviewed interpreted by myself (ER attending) demonstrates:  EKG demonstrates sinus rhythm at a rate of 92, PR 160, QRS 111, QTc 463, no acute ST changes  RADIOLOGY Imaging independently reviewed and interpreted by myself demonstrates:  CXR without visible displaced rib fracture or pneumothorax  PROCEDURES:  Critical Care performed: Yes, see critical care procedure  note(s)  CRITICAL CARE Performed by: Trinna Post   Total critical care time: 32 minutes  Critical care time was exclusive of separately billable procedures and treating other patients.  Critical care was necessary to treat or prevent imminent or life-threatening deterioration.  Critical care was time spent personally by me on the following activities: development of treatment plan with patient and/or surrogate as well as nursing, discussions with consultants, evaluation of patient's response to treatment, examination of patient, obtaining history from patient or surrogate, ordering and performing treatments and interventions, ordering and review of laboratory studies, ordering and review of radiographic studies, pulse oximetry and re-evaluation of patient's  condition.   Procedures   MEDICATIONS ORDERED IN ED: Medications  naloxone (NARCAN) injection 1 mg (has no administration in time range)  naloxone HCl (NARCAN) 4 mg in dextrose 5 % 250 mL infusion (has no administration in time range)  sodium chloride 0.9 % bolus 1,000 mL (0 mLs Intravenous Stopped 02/09/23 1932)  naloxone (NARCAN) injection 1 mg (1 mg Intravenous Given 02/09/23 1835)  potassium chloride 10 mEq in 100 mL IVPB (0 mEq Intravenous Stopped 02/09/23 2313)  ondansetron (ZOFRAN) injection 4 mg (4 mg Intravenous Given 02/09/23 2016)     IMPRESSION / MDM / ASSESSMENT AND PLAN / ED COURSE  I reviewed the triage vital signs and the nursing notes.  Differential diagnosis includes, but is not limited to, cardiac arrest secondary to opiate intoxication, consideration for ACS, rhabdomyolysis, electrolyte abnormality  Patient's presentation is most consistent with acute presentation with potential threat to life or bodily function.  27 year old male presenting after opiate overdose complicated by cardiac arrest.  Patient awake but somnolent on presentation here.  Shortly after my initial evaluation, notified by RN that patient was becoming apneic, unresponsive.  He was given additional 1 mg of Narcan.  He was observed for several hours and did not require repeat dosing.  He was given IV fluids and Zofran.  His initial lab work demonstrated mild leukocytosis.  CK within normal limits.  CMP notable for hypokalemia with K of 2.7.  He was ordered for IV repletion.  His initial troponin was negative, but repeat did uptrend to 32.  In the setting of cardiac arrest with uptrending troponin, do think patient is appropriate for admission.  On reevaluation, patient is more awake but remains somnolent.  He is agreeable with plan for admission.  Will reach out to hospitalist team.  Case discussed with Dr. Allena Katz.  She will evaluate the patient for anticipated admission.     FINAL CLINICAL IMPRESSION(S) /  ED DIAGNOSES   Final diagnoses:  Opiate overdose, accidental or unintentional, initial encounter [facility])  Cardiac arrest (HCC)     Rx / DC Orders   ED Discharge Orders     None        Note:  This document was prepared using Dragon voice recognition software and may include unintentional dictation errors.   Trinna Post, MD 02/09/23 [phone]

## 2023-02-10 ENCOUNTER — Inpatient Hospital Stay (HOSPITAL_COMMUNITY)
Admit: 2023-02-10 | Discharge: 2023-02-10 | Disposition: A | Discharge status: Home / Self Care | Payer: Self-pay | Attending: Internal Medicine | Admitting: Internal Medicine

## 2023-02-10 DIAGNOSIS — F1910 Other psychoactive substance abuse, uncomplicated: Secondary | ICD-10-CM

## 2023-02-10 DIAGNOSIS — T40601A Poisoning by unspecified narcotics, accidental (unintentional), initial encounter: Secondary | ICD-10-CM

## 2023-02-10 DIAGNOSIS — R4182 Altered mental status, unspecified: Secondary | ICD-10-CM

## 2023-02-10 DIAGNOSIS — R0609 Other forms of dyspnea: Secondary | ICD-10-CM

## 2023-02-10 LAB — URINE DRUG SCREEN, QUALITATIVE (ARMC ONLY)
Amphetamines, Ur Screen: POSITIVE — AB
Barbiturates, Ur Screen: NOT DETECTED
Benzodiazepine, Ur Scrn: NOT DETECTED
Cannabinoid 50 Ng, Ur ~~LOC~~: POSITIVE — AB
Cocaine Metabolite,Ur ~~LOC~~: NOT DETECTED
MDMA (Ecstasy)Ur Screen: POSITIVE — AB
Methadone Scn, Ur: NOT DETECTED
Opiate, Ur Screen: POSITIVE — AB
Phencyclidine (PCP) Ur S: NOT DETECTED
Tricyclic, Ur Screen: NOT DETECTED

## 2023-02-10 LAB — COMPREHENSIVE METABOLIC PANEL
ALT: 18 U/L (ref 0–44)
AST: 23 U/L (ref 15–41)
Albumin: 4.4 g/dL (ref 3.5–5.0)
Alkaline Phosphatase: 56 U/L (ref 38–126)
Anion gap: 9 (ref 5–15)
BUN: 20 mg/dL (ref 6–20)
CO2: 23 mmol/L (ref 22–32)
Calcium: 8.3 mg/dL — ABNORMAL LOW (ref 8.9–10.3)
Chloride: 103 mmol/L (ref 98–111)
Creatinine, Ser: 0.94 mg/dL (ref 0.61–1.24)
GFR, Estimated: >60 mL/min (ref >60)
Glucose, Bld: 138 mg/dL — ABNORMAL HIGH (ref 70–99)
Potassium: 4.1 mmol/L (ref 3.5–5.1)
Sodium: 135 mmol/L (ref 135–145)
Total Bilirubin: 0.5 mg/dL (ref 0.3–1.2)
Total Protein: 7.4 g/dL (ref 6.5–8.1)

## 2023-02-10 LAB — CBC
HCT: 41 % (ref 39.0–52.0)
Hemoglobin: 13.6 g/dL (ref 13.0–17.0)
MCH: 27.2 pg (ref 26.0–34.0)
MCHC: 33.2 g/dL (ref 30.0–36.0)
MCV: 82 fL (ref 80.0–100.0)
Platelets: 213 10*3/uL (ref 150–400)
RBC: 5 MIL/uL (ref 4.22–5.81)
RDW: 12.8 % (ref 11.5–15.5)
WBC: 8.6 10*3/uL (ref 4.0–10.5)
nRBC: 0 % (ref 0.0–0.2)

## 2023-02-10 LAB — GLUCOSE, CAPILLARY: Glucose-Capillary: 148 mg/dL — ABNORMAL HIGH (ref 70–99)

## 2023-02-10 LAB — URINALYSIS, ROUTINE W REFLEX MICROSCOPIC
Bacteria, UA: NONE SEEN
Bilirubin Urine: NEGATIVE
Glucose, UA: NEGATIVE mg/dL
Hgb urine dipstick: NEGATIVE
Ketones, ur: 5 mg/dL — AB
Leukocytes,Ua: NEGATIVE
Nitrite: NEGATIVE
Protein, ur: 100 mg/dL — AB
Specific Gravity, Urine: 1.035 — ABNORMAL HIGH (ref 1.005–1.030)
pH: 5 (ref 5.0–8.0)

## 2023-02-10 LAB — MAGNESIUM
Magnesium: 1.9 mg/dL (ref 1.7–2.4)
Magnesium: 1.8 mg/dL (ref 1.7–2.4)

## 2023-02-10 LAB — TROPONIN I (HIGH SENSITIVITY)
Troponin I (High Sensitivity): 49 ng/L — ABNORMAL HIGH (ref <18)
Troponin I (High Sensitivity): 32 ng/L — ABNORMAL HIGH (ref <18)

## 2023-02-10 LAB — MRSA NEXT GEN BY PCR, NASAL: MRSA by PCR Next Gen: NOT DETECTED

## 2023-02-10 LAB — D-DIMER, QUANTITATIVE: D-Dimer, Quant: 0.69 ug/mL-FEU — ABNORMAL HIGH (ref 0.00–0.50)

## 2023-02-10 LAB — HIV ANTIBODY (ROUTINE TESTING W REFLEX): HIV Screen 4th Generation wRfx: NONREACTIVE

## 2023-02-10 LAB — ETHANOL: Alcohol, Ethyl (B): <10 mg/dL (ref <10)

## 2023-02-10 LAB — PROCALCITONIN: Procalcitonin: <0.1 ng/mL

## 2023-02-10 LAB — ECHOCARDIOGRAM COMPLETE: Height: 72 in

## 2023-02-10 LAB — LACTIC ACID, PLASMA: Lactic Acid, Venous: 2.2 mmol/L (ref 0.5–1.9)

## 2023-02-10 MED ORDER — PANTOPRAZOLE SODIUM 40 MG IV SOLR
40.0000 mg | Freq: Two times a day (BID) | INTRAVENOUS | Status: DC
  Administered 2023-02-10: 40 mg via INTRAVENOUS
  Filled 2023-02-10: qty 10

## 2023-02-10 MED ORDER — THIAMINE MONONITRATE 100 MG PO TABS
100.0000 mg | ORAL_TABLET | Freq: Every day | ORAL | Status: DC
  Administered 2023-02-10: 100 mg via ORAL
  Filled 2023-02-10: qty 1

## 2023-02-10 MED ORDER — SODIUM CHLORIDE 0.9 % IV SOLN
12.5000 mg | Freq: Three times a day (TID) | INTRAVENOUS | Status: DC | PRN
  Administered 2023-02-10: 12.5 mg via INTRAVENOUS
  Filled 2023-02-10: qty 12.5

## 2023-02-10 MED ORDER — LORAZEPAM 1 MG PO TABS: 1.0000 mg | ORAL_TABLET | ORAL | Status: DC | PRN

## 2023-02-10 MED ORDER — ORAL CARE MOUTH RINSE: 15.0000 mL | OROMUCOSAL | Status: DC | PRN

## 2023-02-10 MED ORDER — LACTATED RINGERS IV SOLN: INTRAVENOUS | Status: DC

## 2023-02-10 MED ORDER — FOLIC ACID 1 MG PO TABS
1.0000 mg | ORAL_TABLET | Freq: Every day | ORAL | Status: DC
  Administered 2023-02-10: 1 mg via ORAL
  Filled 2023-02-10: qty 1

## 2023-02-10 MED ORDER — LORAZEPAM 2 MG/ML IJ SOLN
1.0000 mg | INTRAMUSCULAR | Status: DC | PRN
  Administered 2023-02-10: 2 mg via INTRAVENOUS
  Filled 2023-02-10: qty 1

## 2023-02-10 MED ORDER — ADULT MULTIVITAMIN W/MINERALS CH
1.0000 | ORAL_TABLET | Freq: Every day | ORAL | Status: DC
  Administered 2023-02-10: 1 via ORAL
  Filled 2023-02-10: qty 1

## 2023-02-10 MED ORDER — THIAMINE HCL 100 MG/ML IJ SOLN: 100.0000 mg | Freq: Every day | INTRAMUSCULAR | Status: DC

## 2023-02-10 NOTE — Assessment & Plan Note (Signed)
Unclear if pt was just unresponsive or had arrested.  We will monitor in stepdown and 2 d echo.

## 2023-02-10 NOTE — Assessment & Plan Note (Signed)
Replaced and we will follow.

## 2023-02-10 NOTE — Progress Notes (Signed)
Pt to transfer to 1C-122 per MD order. Report called to [facility]. Pt able to stand up and transferred to hospital bed.All belongings sent with pt. Pt transported by charge RN. VSS at this time.

## 2023-02-10 NOTE — Assessment & Plan Note (Signed)
CIWA  protocol.  Thiamine.

## 2023-02-10 NOTE — Assessment & Plan Note (Signed)
2/2 to drug abuse pt has used multiple agents found on UDS.  Has had good response to narcan and due to him becoming somnolent we started pt on Narcan drip . CIWA protocol.

## 2023-02-10 NOTE — TOC Initial Note (Signed)
Transition of Care (TOC) - Initial/Assessment Note    Patient Details  Name: Melvin Griffith. MRN: [id number] Date of Birth: [birth year]  Transition of Care [facility]) CM/SW Contact:    Kreg Shropshire, RN Phone Number: 02/10/2023, 10:52 AM  Clinical Narrative:                 Cm received consult for SA. Pt declined any resources for substance abuse at this time. Stated he wanted resources for pcp in the area. Cm added [facility] due to pt not having insurance in the [place]. He lives with father. Father or family friend will be available to pick pt up from hospital whenever d/c. No dme or home health services prior to admission at this time. Cm will continue to follow for toc needs.  Expected Discharge Plan: Home/Self Care Barriers to Discharge: Continued Medical Work up   Patient Goals and CMS Choice            Expected Discharge Plan and Services       Living arrangements for the past 2 months: Single Family Home                                      Prior Living Arrangements/Services Living arrangements for the past 2 months: Single Family Home Lives with:: Self, Parents   Do you feel safe going back to the place where you live?: Yes               Activities of Daily Living      Permission Sought/Granted            Permission granted to share info w Relationship: Rebeca Allegra Sr. Father     Emotional Assessment Appearance:: Appears younger than stated age     Orientation: : Oriented to Self, Oriented to Place, Oriented to  Time, Oriented to Situation Alcohol / Substance Use: Illicit Drugs    Admission diagnosis:  Cardiac arrest (HCC) [I46.9] Opiate overdose, accidental or unintentional, initial encounter (HCC) [T40.601A] AMS (altered mental status) [R41.82] Patient Active Problem List   Diagnosis Date Noted   Polysubstance abuse (HCC) 02/10/2023   AMS (altered mental status) 02/09/2023   Cardiac arrest (HCC)  02/09/2023   Hypokalemia 02/09/2023   History of alcohol abuse 02/09/2023   Tobacco abuse 02/09/2023   PCP:  Patient, No Pcp Per Pharmacy:   [facility] [address] [place] - [address] [place] [address] Phone: [phone] Fax: [phone]     Social Determinants of Health (SDOH) Social History: SDOH Screenings   Tobacco Use: High Risk (08/16/2020)   SDOH Interventions:     Readmission Risk Interventions     No data to display

## 2023-02-10 NOTE — ED Notes (Signed)
Called report to beth rn [phone]

## 2023-02-10 NOTE — Assessment & Plan Note (Signed)
TTS consult 

## 2023-02-10 NOTE — Progress Notes (Addendum)
[facility]  - [place] at [facility]   PATIENT NAME: Melvin Griffith    MR#:  [id number]  DATE OF BIRTH:  [birth year]  SUBJECTIVE:  no family at bedside. Patient initially sleepy however workup answered all questions appropriately. Agreeable to try PO diet. Will wean him off Narcan drip. Remembers using some drugs given by a friend.    VITALS:  Blood pressure 127/85, pulse 62, temperature 97.8 F (36.6 C), temperature source Oral, resp. rate 10, height 6' (1.829 m), weight 80.7 kg, SpO2 100 %.  PHYSICAL EXAMINATION:   GENERAL:  27 y.o.-year-old patient with no acute distress.  LUNGS: Normal breath sounds bilaterally, no wheezing CARDIOVASCULAR: S1, S2 normal. No murmur   ABDOMEN: Soft, nontender, nondistended. Bowel sounds present.  EXTREMITIES: No  edema b/l.    NEUROLOGIC: nonfocal  patient is alert and awake SKIN: No obvious rash, lesion, or ulcer.   LABORATORY PANEL:  CBC Recent Labs  Lab 02/10/23 0721  WBC 8.6  HGB 13.6  HCT 41.0  PLT 213    Chemistries  Recent Labs  Lab 02/10/23 0104 02/10/23 0721  NA 135  --   K 4.1  --   CL 103  --   CO2 23  --   GLUCOSE 138*  --   BUN 20  --   CREATININE 0.94  --   CALCIUM 8.3*  --   MG 1.9 1.8  AST 23  --   ALT 18  --   ALKPHOS 56  --   BILITOT 0.5  --    Cardiac Enzymes No results for input(s): "TROPONINI" in the last 168 hours. RADIOLOGY:  DG Chest 1 View  Result Date: 02/09/2023 CLINICAL DATA:  Chest compression, evaluate for fracture or pneumothorax. EXAM: CHEST  1 VIEW COMPARISON:  CT angiogram chest 10/29/2014 FINDINGS: Sternotomy wires are present. The heart size and mediastinal contours are within normal limits. Both lungs are clear. The visualized skeletal structures are unremarkable. IMPRESSION: No active disease. Electronically Signed   By: Darliss Cheney M.D.   On: 02/09/2023 19:12    Assessment and Plan Melvin Griffith. is a 27 y.o. male presenting to the emergency  department for evaluation following a ?cardiac arrest.  Patient was found unresponsive on the pavement by a bystander.  Unknown downtime.  Reportedly without a pulse, received CPR in the field, unknown duration.  He was given 1 mg of Narcan after which patient awoke.  On presentation to the ER, patient is somnolent but briefly arousable.   NO EMS notes in the chart. Patient has no cardiac history. Has history of substance abuse. Does not remember what drugs to use close remember he could've use fentanyl.   Acute metabolic encephalopathy/altered mental status in the setting of polysubstance drug abuse urine drug screen positive for amphetamines, MDMA, cannabinoid, opiates -- patient placed on Narcan drip -- now alert oriented times three. Will wean off Narcan drip. -- Start PO diet -- labs stable -- patient advised to abstain from using drugs. -- TOC for substance abuse resources  ?? Cardiac arrest -- no exact details available. -- EKG showed sinus rhythm. Patient has remained in sinus rhythm with stable blood pressure -- 2D echo  Mild acidosis-- in the setting of substance abuse -- patient received IV fluids   transfer out of ICU    Procedures: none Family communication : left VM for Father  Consults : CODE STATUS: full DVT Prophylaxis : heparin Level of care: Med-Surg Status is: Inpatient  Remains inpatient appropriate because: polysubstance drug abuse. Will monitor one more day and if remains stable discharge tomorrow. Echo to be done today    TOTAL TIME TAKING CARE OF THIS PATIENT: 35 minutes.  >50% time spent on counselling and coordination of care  Note: This dictation was prepared with Dragon dictation along with smaller phrase technology. Any transcriptional errors that result from this process are unintentional.  Enedina Finner M.D    [facility]   CC: Primary care physician; Patient, No Pcp Per

## 2023-02-10 NOTE — Assessment & Plan Note (Signed)
-  Nicotine patch 

## 2023-02-10 NOTE — Discharge Instructions (Signed)
You are encouraged to call the [facility] and arrange an application appointment to be screened for service to get set up with a physician for primary care  You may print the application and take with you to the appointment. At this web address [email or web address]  Please Call [facility] for appointment  [phone]  [address] [place], [place] [address]  Office Hours Monday: Closed Tuesday: 9:00am - 4:00pm Wednesday: 9:00am - 4:00pm Thursday: 9:00am - 8:00pm Friday: Closed  Endocrinology 2nd Thursday of the Month: 5:00pm - 8:00pm  Rheumatology/Orthopedic Clinic By appointment only.  Contact for availability.   Services Not Covered Obstetrics Gastrointestinal/Liver Disease

## 2023-02-11 DIAGNOSIS — Z720 Tobacco use: Secondary | ICD-10-CM

## 2023-02-11 DIAGNOSIS — T40604A Poisoning by unspecified narcotics, undetermined, initial encounter: Secondary | ICD-10-CM

## 2023-02-11 LAB — BASIC METABOLIC PANEL
Anion gap: 7 (ref 5–15)
BUN: 11 mg/dL (ref 6–20)
CO2: 28 mmol/L (ref 22–32)
Calcium: 8.4 mg/dL — ABNORMAL LOW (ref 8.9–10.3)
Chloride: 103 mmol/L (ref 98–111)
Creatinine, Ser: 0.85 mg/dL (ref 0.61–1.24)
GFR, Estimated: >60 mL/min (ref >60)
Glucose, Bld: 95 mg/dL (ref 70–99)
Potassium: 3.7 mmol/L (ref 3.5–5.1)
Sodium: 138 mmol/L (ref 135–145)

## 2023-02-11 LAB — BLOOD GAS, ARTERIAL
Acid-Base Excess: 3.2 mmol/L — ABNORMAL HIGH (ref 0.0–2.0)
Bicarbonate: 26.9 mmol/L (ref 20.0–28.0)
O2 Saturation: 100 %
Patient temperature: 37
pCO2 arterial: 37 mmHg (ref 32–48)
pH, Arterial: 7.47 — ABNORMAL HIGH (ref 7.35–7.45)
pO2, Arterial: 132 mmHg — ABNORMAL HIGH (ref 83–108)

## 2023-02-11 LAB — CBC
HCT: 44 % (ref 39.0–52.0)
Hemoglobin: 14.3 g/dL (ref 13.0–17.0)
MCH: 26.9 pg (ref 26.0–34.0)
MCHC: 32.5 g/dL (ref 30.0–36.0)
MCV: 82.9 fL (ref 80.0–100.0)
Platelets: 197 10*3/uL (ref 150–400)
RBC: 5.31 MIL/uL (ref 4.22–5.81)
RDW: 12.5 % (ref 11.5–15.5)
WBC: 7.1 10*3/uL (ref 4.0–10.5)
nRBC: 0 % (ref 0.0–0.2)

## 2023-02-11 LAB — ECHOCARDIOGRAM COMPLETE
Area-P 1/2: 3.65 cm2
S' Lateral: 2.5 cm
Weight: 2846.58 oz

## 2023-02-11 MED ORDER — ONDANSETRON HCL 4 MG/2ML IJ SOLN: 4.0000 mg | Freq: Four times a day (QID) | INTRAMUSCULAR | Status: DC | PRN

## 2023-02-11 MED ORDER — ADULT MULTIVITAMIN W/MINERALS CH: 1.0000 | ORAL_TABLET | Freq: Every day | ORAL | 0 refills | Status: AC

## 2023-02-11 MED ORDER — SODIUM CHLORIDE 0.9 % IV BOLUS
250.0000 mL | Freq: Once | INTRAVENOUS | Status: AC
  Administered 2023-02-11: 250 mL via INTRAVENOUS

## 2023-02-11 NOTE — Progress Notes (Signed)
Pt being discharged home, discharge instructions reviewed with pt, states understanding, pt with no complaints at discharge 

## 2023-02-11 NOTE — Discharge Summary (Signed)
Physician Discharge Summary   Patient: Melvin Griffith. MRN: [id number] DOB: [birth year]  Admit date:     02/09/2023  Discharge date: 02/11/23  Discharge Physician: Enedina Finner   PCP: Patient, No Pcp Per   Recommendations at discharge:    Establish PCP with [facility] abstain from using street drugs and smoking. Refrain from using alcohol  Discharge Diagnoses: Principal Problem:   AMS (altered mental status) Active Problems:   Polysubstance abuse (HCC)   Cardiac arrest (HCC)   History of alcohol abuse   Hypokalemia   Tobacco abuse   Opiate overdose (HCC)   Melvin Griffith. is a 27 y.o. male presenting to the emergency department for evaluation following a ?cardiac arrest.  Patient was found unresponsive on the pavement by a bystander.  Unknown downtime.  Reportedly without a pulse, received CPR in the field, unknown duration.  He was given 1 mg of Narcan after which patient awoke.  On presentation to the ER, patient is somnolent but briefly arousable.    NO EMS notes in the chart. Patient has no cardiac history. Has history of substance abuse. Does not remember what drugs to use close remember he could've use fentanyl.     Acute metabolic encephalopathy/altered mental status in the setting of polysubstance drug abuse urine drug screen positive for amphetamines, MDMA, cannabinoid, opiates -- patient placed on Narcan drip -- now alert oriented times three. Will wean off Narcan drip. -- Start PO diet -- labs stable -- patient advised to abstain from using drugs. -- TOC for substance abuse resources -- overall hemodynamically stable. Patient requesting to go home. No family at bedside.   ?? Cardiac arrest -- no exact details available. -- EKG showed sinus rhythm. Patient has remained in sinus rhythm with stable blood pressure -- 2D echo within normal limits   Mild acidosis-- in the setting of substance abuse -- patient received IV fluids   overall  hemodynamically stable. Discharge to home.       Procedures: none Family communication : left VM for Father 02/10/23 Consults : CODE STATUS: full DVT Prophylaxis : heparin      Disposition: Home Diet recommendation:  Discharge Diet Orders (From admission, onward)     Start     Ordered   02/11/23 0000  Diet - low sodium heart healthy        02/11/23 0905           Regular diet DISCHARGE MEDICATION: Allergies as of 02/11/2023   No Known Allergies      Medication List     STOP taking these medications    amoxicillin 500 MG capsule Commonly known as: AMOXIL   ibuprofen 600 MG tablet Commonly known as: ADVIL       TAKE these medications    multivitamin with minerals Tabs tablet Take 1 tablet by mouth daily.         Condition at discharge: fair  The results of significant diagnostics from this hospitalization (including imaging, microbiology, ancillary and laboratory) are listed below for reference.   Imaging Studies: ECHOCARDIOGRAM COMPLETE  Result Date: 02/11/2023    ECHOCARDIOGRAM REPORT   Patient Name:   Melvin Griffith. Date of Exam: 02/10/2023 Medical Rec #:  [id number]                Height:       72.0 in Accession #:    [id number]  Weight:       177.9 lb Date of Birth:  Jul 14, 1996                 BSA:          2.027 m Patient Age:    27 years                 BP:           112/78 mmHg Patient Gender: M                        HR:           68 bpm. Exam Location:  [facility] Procedure: 2D Echo, Cardiac Doppler and Color Doppler Indications:     R06.00 Dyspnea  History:         Patient has no prior history of Echocardiogram examinations.                  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  Sonographer:     Daphine Deutscher RDCS Referring Phys:  [id number] Sriya Kroeze Diagnosing Phys: Julien Nordmann MD IMPRESSIONS  1.  Left ventricular ejection fraction, by estimation, is 50 to 55%. The left ventricle has low normal function. The left ventricle has no regional wall motion abnormalities. Septal motion consistent with postoperative state. Left ventricular diastolic parameters were normal.  2. Right ventricular systolic function is normal. The right ventricular size is normal. There is normal pulmonary artery systolic pressure. The estimated right ventricular systolic pressure is 19.6 mmHg.  3. The mitral valve is normal in structure. No evidence of mitral valve regurgitation. No evidence of mitral stenosis.  4. The aortic valve is tricuspid. Aortic valve regurgitation is not visualized. No aortic stenosis is present.  5. The inferior vena cava is normal in size with greater than 50% respiratory variability, suggesting right atrial pressure of 3 mmHg.  6. There is borderline dilatation of the aortic root, measuring 38 mm. Ascending aorta 3.4 cm FINDINGS  Left Ventricle: Left ventricular ejection fraction, by estimation, is 50 to 55%. The left ventricle has low normal function. The left ventricle has no regional wall motion abnormalities. The left ventricular internal cavity size was normal in size. There is no left ventricular hypertrophy. Left ventricular diastolic parameters were normal. Right Ventricle: The right ventricular size is normal. No increase in right ventricular wall thickness. Right ventricular systolic function is normal. There is normal pulmonary artery systolic pressure. The tricuspid regurgitant velocity is 1.91 m/s, and  with an assumed right atrial pressure of 5 mmHg, the estimated right ventricular systolic pressure is 19.6 mmHg. Left Atrium: Left atrial size was normal in size. Right Atrium: Right atrial size was normal in size. Pericardium: There is no evidence of pericardial effusion. Mitral Valve: The mitral valve is normal in structure. No evidence of mitral valve regurgitation. No evidence of mitral valve  stenosis. Tricuspid Valve: The tricuspid valve is normal in structure. Tricuspid valve regurgitation is mild . No evidence of tricuspid stenosis. Aortic Valve: The aortic valve is tricuspid. Aortic valve regurgitation is not visualized. No aortic stenosis is present. Pulmonic Valve: The pulmonic valve was normal in structure. Pulmonic valve regurgitation is not visualized. No evidence of pulmonic  stenosis. Aorta: The aortic root is normal in size and structure. There is borderline dilatation of the aortic root, measuring 38 mm. Venous: The inferior vena cava is normal in size with greater than 50% respiratory variability, suggesting right atrial pressure of 3 mmHg. IAS/Shunts: No atrial level shunt detected by color flow Doppler.  LEFT VENTRICLE PLAX 2D LVIDd:         4.10 cm   Diastology LVIDs:         2.50 cm   LV e' medial:    9.30 cm/s LV PW:         0.90 cm   LV E/e' medial:  7.4 LV IVS:        0.90 cm   LV e' lateral:   12.50 cm/s LVOT diam:     2.40 cm   LV E/e' lateral: 5.5 LV SV:         65 LV SV Index:   32 LVOT Area:     4.52 cm  RIGHT VENTRICLE             IVC RV Basal diam:  3.70 cm     IVC diam: 1.60 cm RV S prime:     12.75 cm/s TAPSE (M-mode): 2.1 cm LEFT ATRIUM             Index        RIGHT ATRIUM           Index LA diam:        4.60 cm 2.27 cm/m   RA Area:     16.70 cm LA Vol (A2C):   43.4 ml 21.41 ml/m  RA Volume:   54.90 ml  27.08 ml/m LA Vol (A4C):   32.4 ml 15.98 ml/m LA Biplane Vol: 37.5 ml 18.50 ml/m  AORTIC VALVE LVOT Vmax:   72.50 cm/s LVOT Vmean:  49.750 cm/s LVOT VTI:    0.144 m  AORTA Ao Root diam: 3.65 cm Ao Asc diam:  3.40 cm MITRAL VALVE               TRICUSPID VALVE MV Area (PHT): 3.65 cm    TR Peak grad:   14.6 mmHg MV Decel Time: 208 msec    TR Vmax:        191.00 cm/s MV E velocity: 69.10 cm/s MV A velocity: 62.85 cm/s  SHUNTS MV E/A ratio:  1.10        Systemic VTI:  0.14 m                            Systemic Diam: 2.40 cm Julien Nordmann MD Electronically signed by  Julien Nordmann MD Signature Date/Time: 02/11/2023/8:22:09 AM    Final    DG Chest 1 View  Result Date: 02/09/2023 CLINICAL DATA:  Chest compression, evaluate for fracture or pneumothorax. EXAM: CHEST  1 VIEW COMPARISON:  CT angiogram chest 10/29/2014 FINDINGS: Sternotomy wires are present. The heart size and mediastinal contours are within normal limits. Both lungs are clear. The visualized skeletal structures are unremarkable. IMPRESSION: No active disease. Electronically Signed   By: Darliss Cheney M.D.   On: 02/09/2023 19:12    Microbiology: Results for orders placed or performed during the hospital encounter of 02/09/23  MRSA Next Gen by PCR, Nasal     Status: None   Collection Time: 02/10/23 12:31 AM   Specimen: Nasal Mucosa; Nasal Swab  Result Value Ref Range Status   MRSA by  PCR Next Gen NOT DETECTED NOT DETECTED Final    Comment: (NOTE) The GeneXpert MRSA Assay (FDA approved for NASAL specimens only), is one component of a comprehensive MRSA colonization surveillance program. It is not intended to diagnose MRSA infection nor to guide or monitor treatment for MRSA infections. Test performance is not FDA approved in patients less than 8 years old. Performed at [facility], [address] [place], [place] [address]     Labs: CBC: Recent Labs  Lab 02/09/23 1829 02/10/23 0721 02/11/23 0032  WBC 11.3* 8.6 7.1  NEUTROABS 6.6  --   --   HGB 14.7 13.6 14.3  HCT 45.3 41.0 44.0  MCV 83.1 82.0 82.9  PLT 275 213 197   Basic Metabolic Panel: Recent Labs  Lab 02/09/23 1829 02/10/23 0104 02/10/23 0721 02/11/23 0032  NA 136 135  --  138  K 2.7* 4.1  --  3.7  CL 104 103  --  103  CO2 22 23  --  28  GLUCOSE 130* 138*  --  95  BUN 22* 20  --  11  CREATININE 1.14 0.94  --  0.85  CALCIUM 8.3* 8.3*  --  8.4*  MG  --  1.9 1.8  --    Liver Function Tests: Recent Labs  Lab 02/09/23 1829 02/10/23 0104  AST 23 23  ALT 19 18  ALKPHOS 58 56  BILITOT 0.6 0.5  PROT  7.6 7.4  ALBUMIN 4.5 4.4   CBG: Recent Labs  Lab 02/10/23 0026  GLUCAP 148*    Discharge time spent: greater than 30 minutes.  Signed: Enedina Finner, MD [facility] 02/11/2023
